# Patient Record
Sex: Male | Born: 2000 | Race: White | Hispanic: No | Marital: Single | Smoking: Never smoker
Health system: Southern US, Community
[De-identification: ages and names within clinical notes are randomized; demographics above are authoritative.]

---
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
## 2023-04-01 ENCOUNTER — Emergency Department
Admission: EM | Admit: 2023-04-01 | Discharge: 2023-04-01 | Disposition: A | Discharge status: Home / Self Care | Payer: BC Managed Care – PPO | Attending: Student in an Organized Health Care Education/Training Program | Admitting: Student in an Organized Health Care Education/Training Program

## 2023-04-01 ENCOUNTER — Emergency Department: Payer: BC Managed Care – PPO

## 2023-04-01 ENCOUNTER — Other Ambulatory Visit: Payer: Self-pay

## 2023-04-01 DIAGNOSIS — R410 Disorientation, unspecified: Secondary | ICD-10-CM | POA: Diagnosis not present

## 2023-04-01 DIAGNOSIS — R519 Headache, unspecified: Secondary | ICD-10-CM | POA: Diagnosis present

## 2023-04-01 DIAGNOSIS — H538 Other visual disturbances: Secondary | ICD-10-CM | POA: Diagnosis not present

## 2023-04-01 DIAGNOSIS — R42 Dizziness and giddiness: Secondary | ICD-10-CM | POA: Diagnosis not present

## 2023-04-01 DIAGNOSIS — R531 Weakness: Secondary | ICD-10-CM | POA: Diagnosis not present

## 2023-04-01 LAB — CBC WITH DIFFERENTIAL/PLATELET
Abs Immature Granulocytes: 0.1 10*3/uL — ABNORMAL HIGH (ref 0.00–0.07)
Basophils Absolute: 0 10*3/uL (ref 0.0–0.1)
Basophils Relative: 0 %
Eosinophils Absolute: 0 10*3/uL (ref 0.0–0.5)
Eosinophils Relative: 0 %
HCT: 49.2 % (ref 39.0–52.0)
Hemoglobin: 17.2 g/dL — ABNORMAL HIGH (ref 13.0–17.0)
Immature Granulocytes: 1 %
Lymphocytes Relative: 27 %
Lymphs Abs: 2.3 10*3/uL (ref 0.7–4.0)
MCH: 30.6 pg (ref 26.0–34.0)
MCHC: 35 g/dL (ref 30.0–36.0)
MCV: 87.5 fL (ref 80.0–100.0)
Monocytes Absolute: 0.6 10*3/uL (ref 0.1–1.0)
Monocytes Relative: 7 %
Neutro Abs: 5.6 10*3/uL (ref 1.7–7.7)
Neutrophils Relative %: 65 %
Platelets: 278 10*3/uL (ref 150–400)
RBC: 5.62 MIL/uL (ref 4.22–5.81)
RDW: 12.9 % (ref 11.5–15.5)
WBC: 8.7 10*3/uL (ref 4.0–10.5)
nRBC: 0 % (ref 0.0–0.2)

## 2023-04-01 LAB — BASIC METABOLIC PANEL
Anion gap: 12 (ref 5–15)
BUN: 18 mg/dL (ref 6–20)
CO2: 27 mmol/L (ref 22–32)
Calcium: 9.5 mg/dL (ref 8.9–10.3)
Chloride: 99 mmol/L (ref 98–111)
Creatinine, Ser: 0.88 mg/dL (ref 0.61–1.24)
GFR, Estimated: >60 mL/min (ref >60)
Glucose, Bld: 107 mg/dL — ABNORMAL HIGH (ref 70–99)
Potassium: 3.5 mmol/L (ref 3.5–5.1)
Sodium: 138 mmol/L (ref 135–145)

## 2023-04-01 MED ORDER — IOHEXOL 300 MG/ML  SOLN
75.0000 mL | Freq: Once | INTRAMUSCULAR | Status: AC | PRN
  Administered 2023-04-01: 75 mL via INTRAVENOUS

## 2023-04-01 NOTE — ED Provider Notes (Signed)
[facility] Provider Note    Event Date/Time   First MD Initiated Contact with Patient 04/01/23 1530     (approximate)   History   Headache   HPI  Chris Peck is a 23 y.o. male who presents to the ER for evaluation of several days to few weeks of confusion vertigo symptoms some intermittent blurry vision.  Pain behind his left eye.  Some left upper extremity weakness that he noted today.  States [facility] and then followed up with ENT and was diagnosed with sinusitis was given antibiotics without much improvement.  Was given a course of steroids without much improvement.  Denies any neck stiffness no measured fevers or temperature.     Physical Exam   Triage Vital Signs: ED Triage Vitals  Encounter Vitals Group     BP 04/01/23 1303 (!) 152/75     Systolic BP Percentile --      Diastolic BP Percentile --      Pulse Rate 04/01/23 1303 92     Resp 04/01/23 1303 20     Temp 04/01/23 1303 98.2 F (36.8 C)     Temp src --      SpO2 04/01/23 1303 99 %     Weight --      Height --      Head Circumference --      Peak Flow --      Pain Score 04/01/23 1312 6     Pain Loc --      Pain Education --      Exclude from Growth Chart --     Most recent vital signs: Vitals:   04/01/23 1558 04/01/23 1607  BP:  134/74  Pulse:  73  Resp:  18  Temp:  98.7 F (37.1 C)  SpO2: 99% 100%     Constitutional: Alert  Eyes: Conjunctivae are normal.  Head: Atraumatic. Nose: No congestion/rhinnorhea. Mouth/Throat: Mucous membranes are moist.   Neck: Painless ROM.  Cardiovascular:   Good peripheral circulation. Respiratory: Normal respiratory effort.  No retractions.  Gastrointestinal: Soft and nontender.  Musculoskeletal:  no deformity Neurologic:  CN- intact.  No facial droop, Normal FNF.  Normal heel to shin.  Sensation intact bilaterally. Normal speech and language. No gross focal neurologic deficits are appreciated. No gait  instability. Skin:  Skin is warm, dry and intact. No rash noted. Psychiatric: Mood and affect are normal. Speech and behavior are normal.    ED Results / Procedures / Treatments   Labs (all labs ordered are listed, but only abnormal results are displayed) Labs Reviewed  BASIC METABOLIC PANEL - Abnormal; Notable for the following components:      Result Value   Glucose, Bld 107 (*)    All other components within normal limits  CBC WITH DIFFERENTIAL/PLATELET - Abnormal; Notable for the following components:   Hemoglobin 17.2 (*)    Abs Immature Granulocytes 0.10 (*)    All other components within normal limits     EKG  ED ECG REPORT I, Willy Eddy, the attending physician, personally viewed and interpreted this ECG.   Date: 04/01/2023  EKG Time: 13:24  Rate: 80  Rhythm: sinus  Axis: normal  Intervals: normal  ST&T Change: no stemi, no depressions    RADIOLOGY Please see ED Course for my review and interpretation.  I personally reviewed all radiographic images ordered to evaluate for the above acute complaints and reviewed radiology reports and findings.  These findings were personally discussed with the patient.  Please see medical record for radiology report.    PROCEDURES:  Critical Care performed: No  Procedures   MEDICATIONS ORDERED IN ED: Medications  iohexol (OMNIPAQUE) 300 MG/ML solution 75 mL (75 mLs Intravenous Contrast Given 04/01/23 1619)     IMPRESSION / MDM / ASSESSMENT AND PLAN / ED COURSE  I reviewed the triage vital signs and the nursing notes.                              Differential diagnosis includes, but is not limited to, Dehydration, sepsis, pna, uti, hypoglycemia, cva, drug effect, withdrawal, encephalitis  Patient presenting to the ER for evaluation of symptoms as described above.  Based on symptoms, risk factors and considered above differential, this presenting complaint could reflect a potentially life-threatening illness  therefore the patient will be placed on continuous pulse oximetry and telemetry for monitoring.  Laboratory evaluation will be sent to evaluate for the above complaints.  Patient's well-appearing but given the duration of symptoms and constellation of findings I am recommending CT imaging of his head with and without contrast to further evaluate.  This does not seem clinically consistent with meningitis or encephalitis.  No fevers.  No recent tick bites or bug bites.  No recent injections or immunizations.  Not consistent with GBS.  Doubt cardiac etiology.    Clinical Course as of 04/01/23 1737  Sat Apr 01, 2023  1719 Patient reassessed.  He feels well.  Still awaiting CT imaging. [PR]  1736 Patient reassessed.  CT imaging on my review and interpretation without evidence of acute abnormality.  Per radiology no acute findings.  Given duration of symptoms of vague nature of the symptoms I do not believe that further diagnostic testing here in the ER clinically indicated.  Patient does appear clinically well.  No objective deficits or findings on exam.  Does endorse that he has been working long hours.  May be either is a component of exhaustion or sleep deprivation.  We discussed these other possible explanations.  Discussed follow-up with PCP.  Discussed signs and symptoms for which she should return to the ER. [PR]    Clinical Course User Index [PR] Willy Eddy, MD     FINAL CLINICAL IMPRESSION(S) / ED DIAGNOSES   Final diagnoses:  Nonintractable headache, unspecified chronicity pattern, unspecified headache type  Confusion     Rx / DC Orders   ED Discharge Orders     None        Note:  This document was prepared using Dragon voice recognition software and may include unintentional dictation errors.    Willy Eddy, MD 04/01/23 [phone]

## 2023-04-01 NOTE — ED Provider Triage Note (Signed)
Emergency Medicine Provider Triage Evaluation Note  Chris Peck , a 23 y.o. male  was evaluated in triage.  Pt complains of "head feels foggy" x3 weeks. Began with what he thought was an ear infection 3 weeks ago, prescribed abx by UC and also saw ENT who gave him steroids without improvement. Reports intermittent vertigo but none currently. No ataxia/speech changes. Feels left sided heaviness x3 days.  There are no active problems to display for this patient.  .  Review of Systems  Positive: Dizzy Negative: Fever, vision changes, CP/SOB  Physical Exam  There were no vitals taken for this visit. Gen:   Awake, no distress   Resp:  Normal effort  MSK:   Moves extremities without difficulty  Other:    Medical Decision Making  Medically screening exam initiated at 1:01 PM.  Appropriate orders placed.  Chris Peck was informed that the remainder of the evaluation will be completed by another provider, this initial triage assessment does not replace that evaluation, and the importance of remaining in the ED until their evaluation is complete.     Jackelyn Hoehn, PA-C 04/01/23 1336

## 2023-04-01 NOTE — ED Triage Notes (Addendum)
Pt c/o pressure in neck and ear bilaterally and confusion/vertigo x2 weeks. Pt was given antibiotics and prednisone for potential ear infection at urgent care and ENT. Pt AOX4, NAD noted. Pt reports "heaviness" down left arm. Upper and lower extremities strong bilaterally. No weakness, facial droop, numbness or difficulty speaking noted. No blurred vision at this time. Pt denies SHOB, CP.

## 2023-04-01 NOTE — ED Notes (Signed)
 Pt back from CT

## 2023-04-01 NOTE — ED Notes (Signed)
 Pt to CT

## 2023-09-26 ENCOUNTER — Other Ambulatory Visit: Payer: Self-pay | Admitting: Student

## 2023-09-26 ENCOUNTER — Encounter: Payer: Self-pay | Admitting: Student

## 2023-09-26 DIAGNOSIS — R519 Headache, unspecified: Secondary | ICD-10-CM

## 2023-09-26 DIAGNOSIS — R531 Weakness: Secondary | ICD-10-CM

## 2023-09-28 ENCOUNTER — Encounter: Payer: Self-pay | Admitting: Student

## 2023-09-30 ENCOUNTER — Ambulatory Visit
Admission: RE | Admit: 2023-09-30 | Discharge: 2023-09-30 | Disposition: A | Discharge status: Home / Self Care | Source: Ambulatory Visit | Attending: Student

## 2023-09-30 DIAGNOSIS — R519 Headache, unspecified: Secondary | ICD-10-CM

## 2023-09-30 DIAGNOSIS — R531 Weakness: Secondary | ICD-10-CM
